# Patient Record
Sex: Female | Born: 1950 | Race: White | Hispanic: No | Marital: Single | Smoking: Former smoker
Health system: Southern US, Community
[De-identification: ages and names within clinical notes are randomized; demographics above are authoritative.]

## PROBLEM LIST (undated history)

## (undated) DIAGNOSIS — M1990 Unspecified osteoarthritis, unspecified site: Secondary | ICD-10-CM

## (undated) DIAGNOSIS — K219 Gastro-esophageal reflux disease without esophagitis: Secondary | ICD-10-CM

## (undated) DIAGNOSIS — E119 Type 2 diabetes mellitus without complications: Secondary | ICD-10-CM

## (undated) DIAGNOSIS — I10 Essential (primary) hypertension: Secondary | ICD-10-CM

## (undated) DIAGNOSIS — J45909 Unspecified asthma, uncomplicated: Secondary | ICD-10-CM

## (undated) HISTORY — PX: BREAST CYST ASPIRATION: SHX578

## (undated) HISTORY — PX: TONSILLECTOMY: SUR1361

## (undated) HISTORY — PX: APPENDECTOMY: SHX54

## (undated) HISTORY — PX: BLADDER SURGERY: SHX569

## (undated) HISTORY — PX: CATARACT EXTRACTION: SUR2

---

## 2000-09-28 ENCOUNTER — Emergency Department (HOSPITAL_COMMUNITY): Admission: EM | Admit: 2000-09-28 | Discharge: 2000-09-28 | Payer: Self-pay | Admitting: Emergency Medicine

## 2000-09-28 ENCOUNTER — Encounter: Payer: Self-pay | Admitting: Emergency Medicine

## 2000-10-02 ENCOUNTER — Encounter: Admission: RE | Admit: 2000-10-02 | Discharge: 2000-10-02 | Payer: Self-pay

## 2001-06-23 HISTORY — PX: ABDOMINAL HYSTERECTOMY: SHX81

## 2004-08-05 ENCOUNTER — Ambulatory Visit: Payer: Self-pay

## 2004-08-16 ENCOUNTER — Ambulatory Visit: Payer: Self-pay

## 2004-09-19 ENCOUNTER — Ambulatory Visit: Payer: Self-pay

## 2005-09-22 ENCOUNTER — Ambulatory Visit: Payer: Self-pay | Admitting: Gastroenterology

## 2005-10-09 ENCOUNTER — Ambulatory Visit: Payer: Self-pay | Admitting: Internal Medicine

## 2005-11-16 ENCOUNTER — Emergency Department: Payer: Self-pay | Admitting: Emergency Medicine

## 2005-12-02 ENCOUNTER — Ambulatory Visit: Payer: Self-pay | Admitting: Surgery

## 2006-03-20 ENCOUNTER — Ambulatory Visit: Payer: Self-pay | Admitting: Internal Medicine

## 2007-09-30 IMAGING — CT CT ABDOMEN WO/W CM
2 of 4 series · 14 of 32 positions shown, 19 images · non-contrast
Comparison: none

REASON FOR EXAM: Kidney mass, abnormal ultrasound
COMMENTS:

PROCEDURE:     CT  - CT ABDOMEN STANDARD W/WO  - October 09, 2005  [DATE]
RESULT:
HISTORY: Renal mass, abnormal ultrasound.

[Series 2: without · axial · non-contrast · 0.78mm/px · z∈[+669,+853]mm · 7 of 31 slices shown, 12 images]
[im 4/31  soft-tissue]
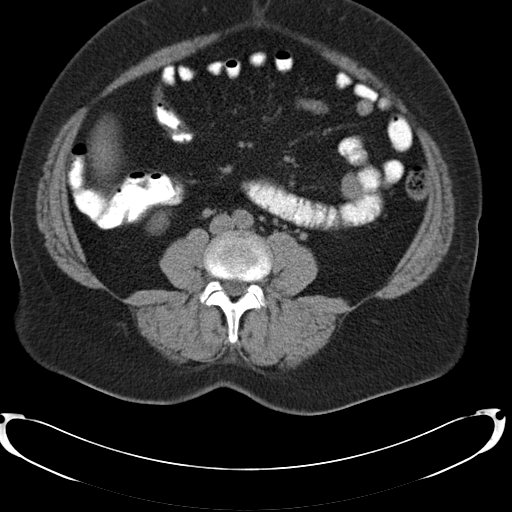
[im 4/31  bone]
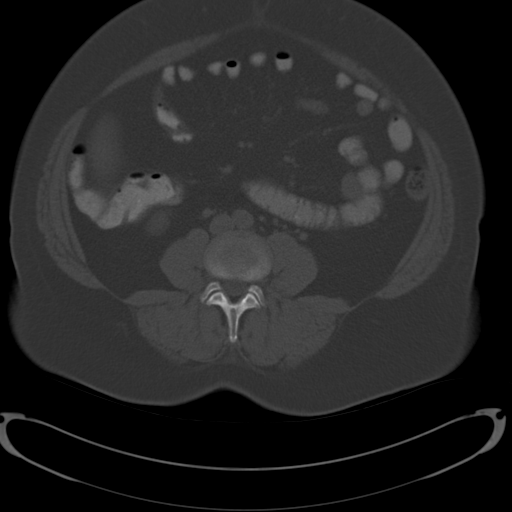
[im 8/31  soft-tissue]
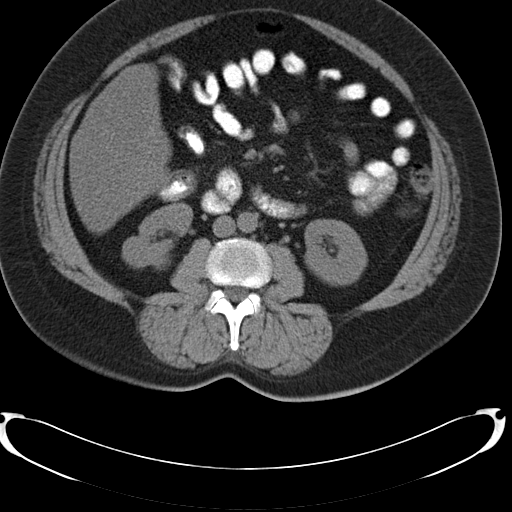
[im 12/31  soft-tissue]
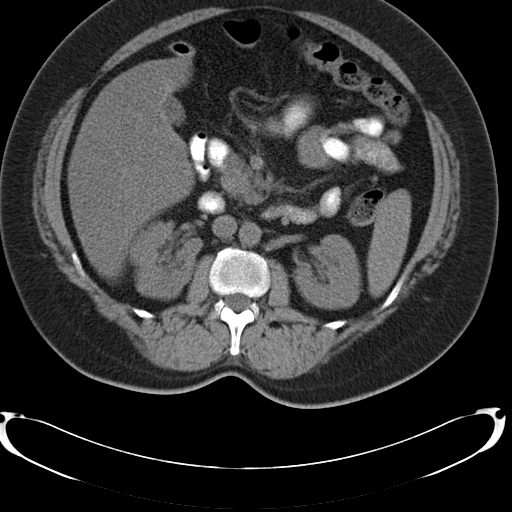
[im 16/31  soft-tissue]
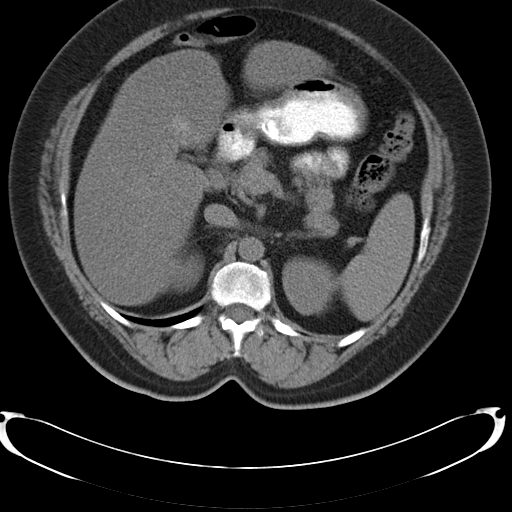
[im 16/31  lung]
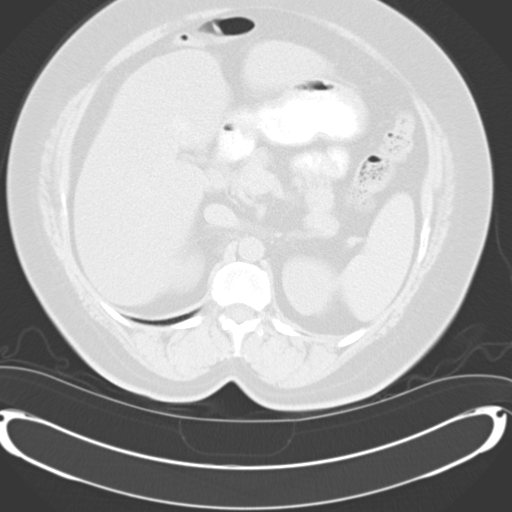
[im 19/31  soft-tissue]
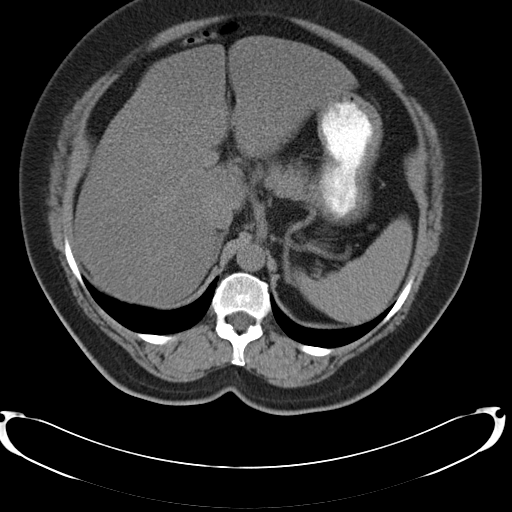
[im 19/31  lung]
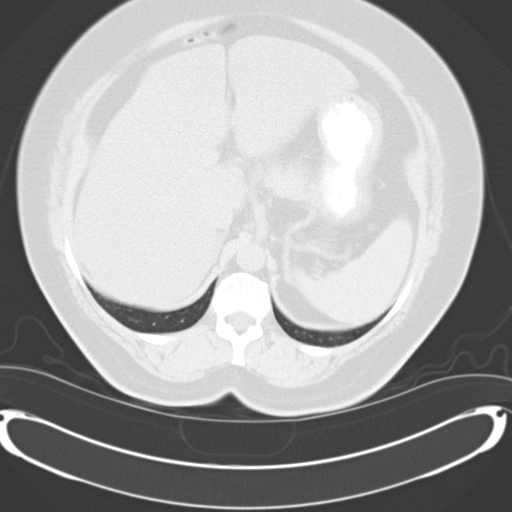
[im 23/31  soft-tissue]
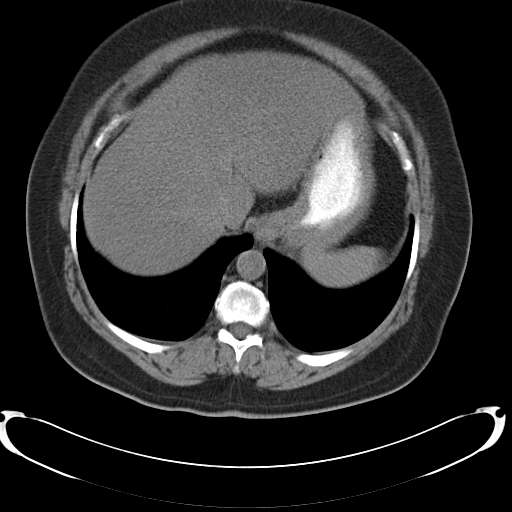
[im 23/31  lung]
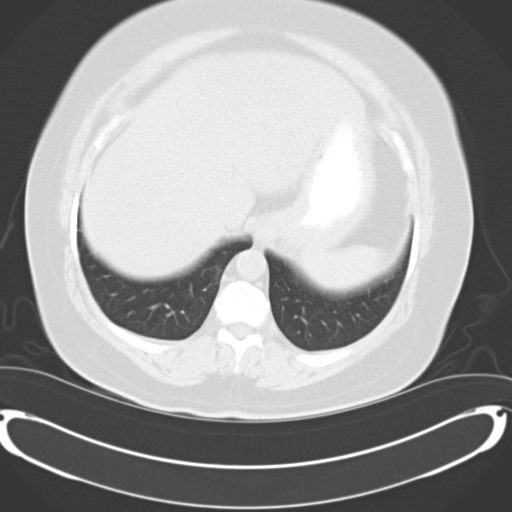
[im 27/31  soft-tissue]
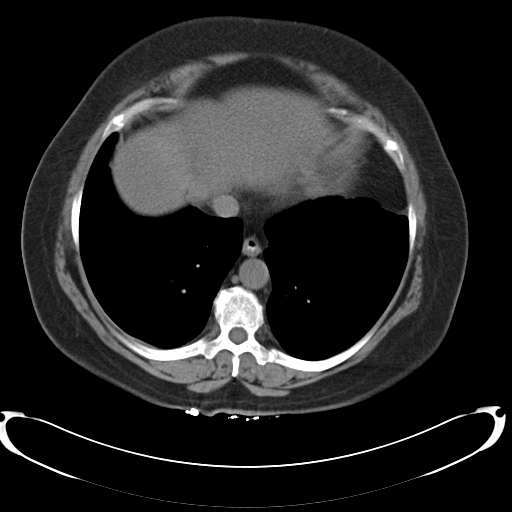
[im 27/31  lung]
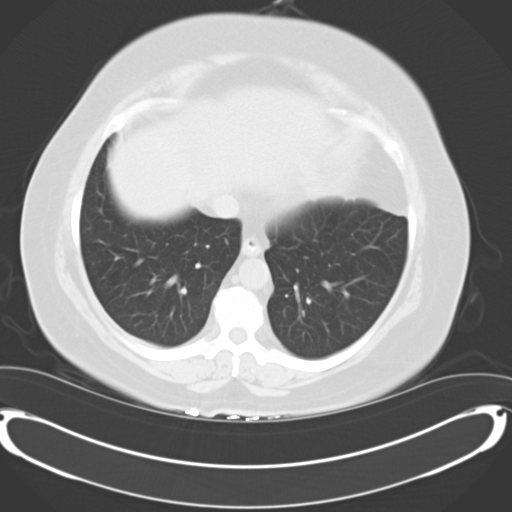

[Series 4: with · axial · 0.78mm/px · z∈[+669,+853]mm · 7 of 31 slices shown]
[im 4/31  soft-tissue]
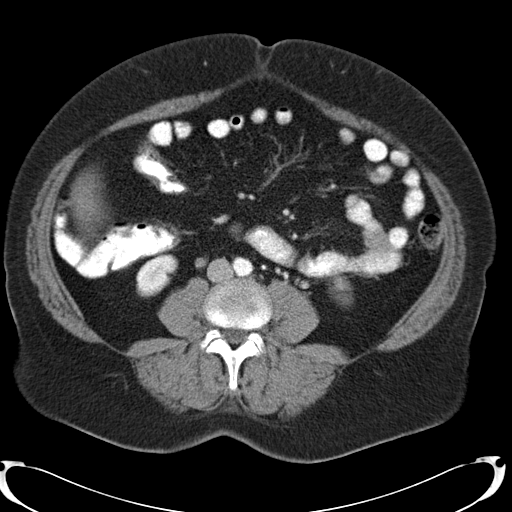
[im 8/31  soft-tissue]
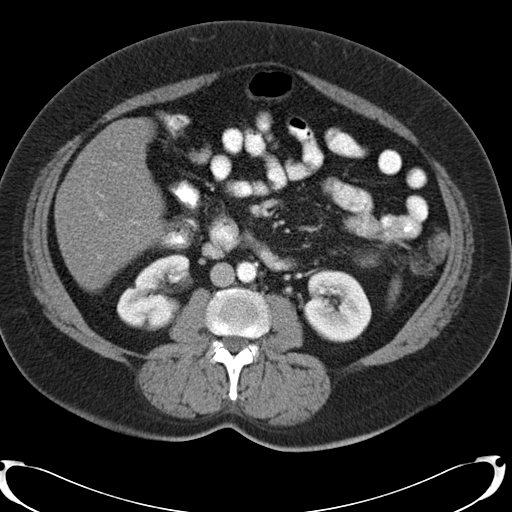
[im 12/31  soft-tissue]
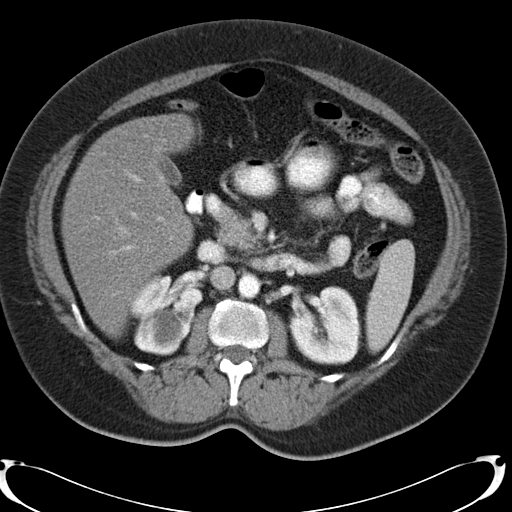
[im 16/31  soft-tissue]
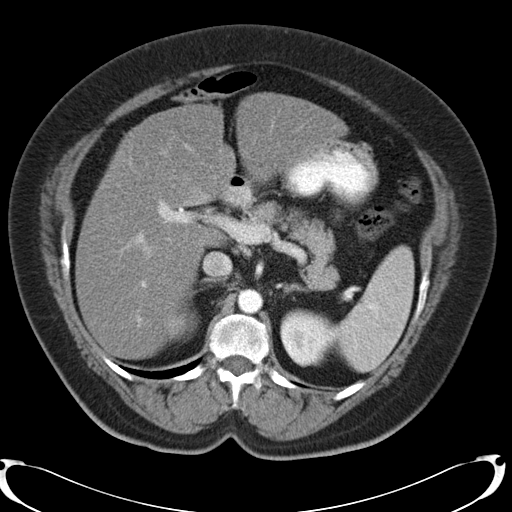
[im 19/31  soft-tissue]
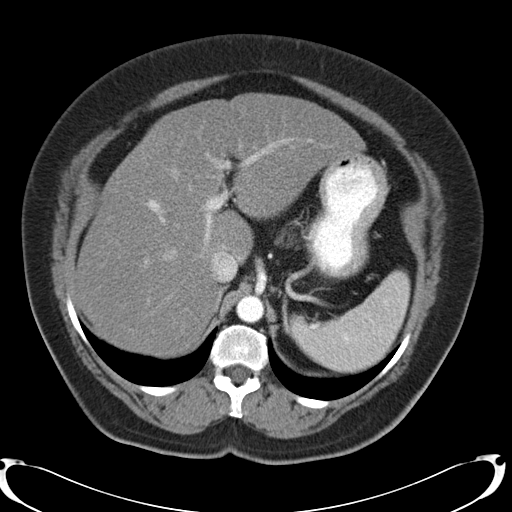
[im 23/31  soft-tissue]
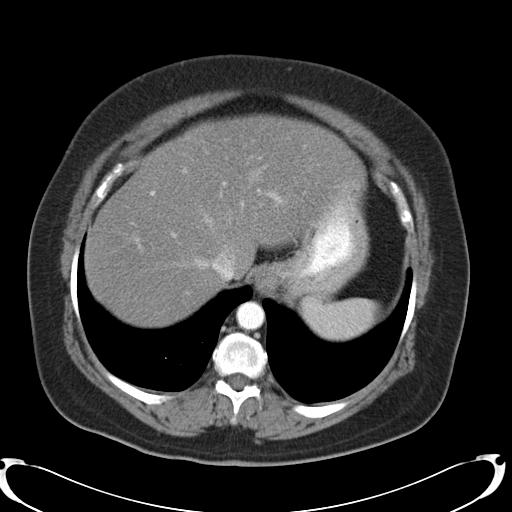
[im 27/31  soft-tissue]
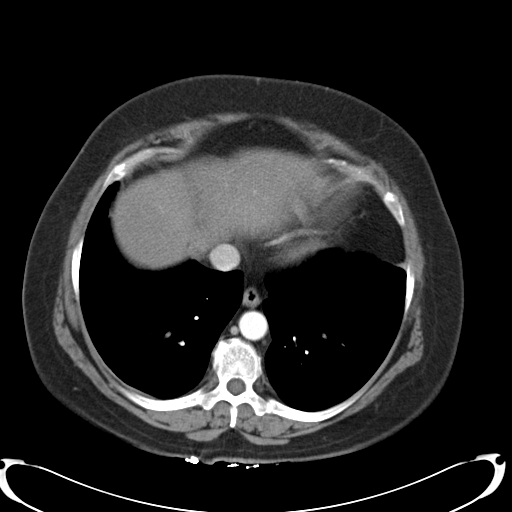

[14 of 32 positions shown; findings below may reference images not displayed]

COMPARISON STUDIES:      Prior abdominal ultrasound of 09/22/2005.

PROCEDURE AND FINDINGS:      Triphasic CT of the abdomen was obtained.  On
the nonenhanced scan, there is no evidence of renal stone.  The previously
identified mass in the upper pole of the RIGHT kidney by ultrasound
represents a simple cyst.  Hounsfield units of 15 are noted.  No definite
complex components are noted by CT.  It may be wise to follow this lesion
with serial ultrasounds at six-month intervals for two years to ensure that
it is stable, given the complex appearance by ultrasound.  The liver and
spleen are unremarkable.  There may be mild fatty replacement of the liver.
The pancreas is unremarkable.  There is no biliary distention.  The portal
and hepatic veins are patent.  The adrenals are normal.  The abdominal aorta
is normal.  The retroperitoneum is normal.  There is no bowel distention.
IMPRESSION: Findings most consistent with a large simple cyst in
the upper pole of the RIGHT kidney.  Given the questionable complex nature
of this mass by ultrasound, and lack of malignant characteristics by CT,
this lesion can be simply followed at six-month intervals with serial renal
ultrasound.  Follow up scans at six-month intervals for a two-year period is
suggested.

## 2010-02-02 ENCOUNTER — Ambulatory Visit: Payer: Self-pay | Admitting: Internal Medicine

## 2010-02-05 ENCOUNTER — Ambulatory Visit: Payer: Self-pay | Admitting: Family Medicine

## 2010-02-06 ENCOUNTER — Ambulatory Visit: Payer: Self-pay | Admitting: Internal Medicine

## 2010-02-09 ENCOUNTER — Ambulatory Visit: Payer: Self-pay | Admitting: Family Medicine

## 2010-02-11 ENCOUNTER — Ambulatory Visit: Payer: Self-pay | Admitting: Internal Medicine

## 2013-03-10 ENCOUNTER — Ambulatory Visit: Payer: Self-pay | Admitting: Family Medicine

## 2013-03-13 ENCOUNTER — Emergency Department: Payer: Self-pay | Admitting: Emergency Medicine

## 2013-03-13 LAB — URINALYSIS, COMPLETE
Bilirubin,UR: NEGATIVE
Blood: NEGATIVE
Glucose,UR: NEGATIVE mg/dL (ref 0–75)
Ketone: NEGATIVE
Nitrite: NEGATIVE
Ph: 9 (ref 4.5–8.0)
Protein: 150
RBC,UR: 14 /HPF (ref 0–5)
Specific Gravity: 1 (ref 1.003–1.030)
Squamous Epithelial: 6
WBC UR: 6 /HPF (ref 0–5)

## 2013-03-13 LAB — CBC
HCT: 42.7 % (ref 35.0–47.0)
HGB: 14.6 g/dL (ref 12.0–16.0)
MCH: 29.1 pg (ref 26.0–34.0)
MCHC: 34.1 g/dL (ref 32.0–36.0)
MCV: 85 fL (ref 80–100)
Platelet: 411 10*3/uL (ref 150–440)
RBC: 5.01 10*6/uL (ref 3.80–5.20)
RDW: 13.7 % (ref 11.5–14.5)
WBC: 14.7 10*3/uL — ABNORMAL HIGH (ref 3.6–11.0)

## 2013-03-13 LAB — BASIC METABOLIC PANEL
Anion Gap: 10 (ref 7–16)
BUN: 14 mg/dL (ref 7–18)
Calcium, Total: 9.4 mg/dL (ref 8.5–10.1)
Chloride: 102 mmol/L (ref 98–107)
Co2: 26 mmol/L (ref 21–32)
Creatinine: 1.03 mg/dL (ref 0.60–1.30)
EGFR (African American): >60
EGFR (Non-African Amer.): 59 — ABNORMAL LOW
Glucose: 156 mg/dL — ABNORMAL HIGH (ref 65–99)
Osmolality: 279 (ref 275–301)
Potassium: 3 mmol/L — ABNORMAL LOW (ref 3.5–5.1)
Sodium: 138 mmol/L (ref 136–145)

## 2014-07-03 ENCOUNTER — Ambulatory Visit: Payer: Self-pay | Admitting: Family Medicine

## 2014-07-03 ENCOUNTER — Ambulatory Visit: Payer: Self-pay | Admitting: Physician Assistant

## 2014-07-03 LAB — URINALYSIS, COMPLETE
Bilirubin,UR: NEGATIVE
Blood: NEGATIVE
Glucose,UR: NEGATIVE
Ketone: NEGATIVE
Nitrite: NEGATIVE
Ph: 7.5 (ref 5.0–8.0)
Specific Gravity: 1.02 (ref 1.000–1.030)

## 2014-07-03 LAB — COMPREHENSIVE METABOLIC PANEL
Albumin: 3.9 g/dL (ref 3.4–5.0)
Alkaline Phosphatase: 134 U/L — ABNORMAL HIGH
Anion Gap: 11 (ref 7–16)
BUN: 15 mg/dL (ref 7–18)
Bilirubin,Total: 0.2 mg/dL (ref 0.2–1.0)
Calcium, Total: 9.5 mg/dL (ref 8.5–10.1)
Chloride: 101 mmol/L (ref 98–107)
Co2: 25 mmol/L (ref 21–32)
Creatinine: 0.9 mg/dL (ref 0.60–1.30)
EGFR (African American): >60
EGFR (Non-African Amer.): >60
Glucose: 117 mg/dL — ABNORMAL HIGH (ref 65–99)
Osmolality: 276 (ref 275–301)
Potassium: 3.3 mmol/L — ABNORMAL LOW (ref 3.5–5.1)
SGOT(AST): 30 U/L (ref 15–37)
SGPT (ALT): 42 U/L
Sodium: 137 mmol/L (ref 136–145)
Total Protein: 8.1 g/dL (ref 6.4–8.2)

## 2014-07-03 LAB — LIPASE, BLOOD: Lipase: 109 U/L (ref 73–393)

## 2014-07-03 LAB — CBC WITH DIFFERENTIAL/PLATELET
Basophil #: 0.2 10*3/uL — ABNORMAL HIGH (ref 0.0–0.1)
Basophil %: 1.2 %
Eosinophil #: 0.2 10*3/uL (ref 0.0–0.7)
Eosinophil %: 1.8 %
HCT: 42.3 % (ref 35.0–47.0)
HGB: 13.9 g/dL (ref 12.0–16.0)
Lymphocyte #: 3.4 10*3/uL (ref 1.0–3.6)
Lymphocyte %: 24.7 %
MCH: 28.7 pg (ref 26.0–34.0)
MCHC: 32.9 g/dL (ref 32.0–36.0)
MCV: 87 fL (ref 80–100)
Monocyte #: 0.8 x10 3/mm (ref 0.2–0.9)
Monocyte %: 5.9 %
Neutrophil #: 9.1 10*3/uL — ABNORMAL HIGH (ref 1.4–6.5)
Neutrophil %: 66.4 %
Platelet: 418 10*3/uL (ref 150–440)
RBC: 4.85 10*6/uL (ref 3.80–5.20)
RDW: 13.4 % (ref 11.5–14.5)
WBC: 13.8 10*3/uL — ABNORMAL HIGH (ref 3.6–11.0)

## 2014-07-03 LAB — AMYLASE: Amylase: 22 U/L — ABNORMAL LOW (ref 25–115)

## 2014-07-07 LAB — URINE CULTURE

## 2014-10-25 ENCOUNTER — Other Ambulatory Visit: Payer: Self-pay

## 2014-10-25 DIAGNOSIS — Z1239 Encounter for other screening for malignant neoplasm of breast: Secondary | ICD-10-CM

## 2014-10-26 ENCOUNTER — Ambulatory Visit
Admission: RE | Admit: 2014-10-26 | Discharge: 2014-10-26 | Disposition: A | Discharge status: Home / Self Care | Payer: No Typology Code available for payment source | Source: Ambulatory Visit | Attending: Family Medicine | Admitting: Family Medicine

## 2014-10-26 ENCOUNTER — Other Ambulatory Visit: Payer: Self-pay | Admitting: Family Medicine

## 2014-10-26 DIAGNOSIS — Z1239 Encounter for other screening for malignant neoplasm of breast: Secondary | ICD-10-CM

## 2014-10-26 DIAGNOSIS — Z1231 Encounter for screening mammogram for malignant neoplasm of breast: Secondary | ICD-10-CM | POA: Diagnosis not present

## 2014-11-02 ENCOUNTER — Encounter: Payer: Self-pay | Admitting: *Deleted

## 2014-11-06 NOTE — Discharge Instructions (Signed)

## 2014-11-08 ENCOUNTER — Encounter: Payer: Self-pay | Admitting: Gastroenterology

## 2014-11-08 ENCOUNTER — Encounter
Admission: RE | Disposition: A | Discharge status: Home / Self Care | Payer: Self-pay | Source: Ambulatory Visit | Attending: Gastroenterology

## 2014-11-08 ENCOUNTER — Ambulatory Visit
Admission: RE | Admit: 2014-11-08 | Discharge: 2014-11-08 | Disposition: A | Discharge status: Home / Self Care | Payer: No Typology Code available for payment source | Source: Ambulatory Visit | Attending: Gastroenterology | Admitting: Gastroenterology

## 2014-11-08 ENCOUNTER — Ambulatory Visit: Payer: No Typology Code available for payment source | Admitting: Anesthesiology

## 2014-11-08 DIAGNOSIS — R140 Abdominal distension (gaseous): Secondary | ICD-10-CM | POA: Diagnosis not present

## 2014-11-08 DIAGNOSIS — R1013 Epigastric pain: Secondary | ICD-10-CM | POA: Diagnosis not present

## 2014-11-08 DIAGNOSIS — J45909 Unspecified asthma, uncomplicated: Secondary | ICD-10-CM | POA: Insufficient documentation

## 2014-11-08 DIAGNOSIS — Z882 Allergy status to sulfonamides status: Secondary | ICD-10-CM | POA: Insufficient documentation

## 2014-11-08 DIAGNOSIS — A048 Other specified bacterial intestinal infections: Secondary | ICD-10-CM | POA: Diagnosis not present

## 2014-11-08 DIAGNOSIS — K2950 Unspecified chronic gastritis without bleeding: Secondary | ICD-10-CM | POA: Insufficient documentation

## 2014-11-08 DIAGNOSIS — K219 Gastro-esophageal reflux disease without esophagitis: Secondary | ICD-10-CM | POA: Insufficient documentation

## 2014-11-08 DIAGNOSIS — Z79899 Other long term (current) drug therapy: Secondary | ICD-10-CM | POA: Insufficient documentation

## 2014-11-08 DIAGNOSIS — R1310 Dysphagia, unspecified: Secondary | ICD-10-CM | POA: Diagnosis present

## 2014-11-08 DIAGNOSIS — K222 Esophageal obstruction: Secondary | ICD-10-CM | POA: Diagnosis not present

## 2014-11-08 HISTORY — DX: Unspecified asthma, uncomplicated: J45.909

## 2014-11-08 HISTORY — DX: Essential (primary) hypertension: I10

## 2014-11-08 HISTORY — DX: Gastro-esophageal reflux disease without esophagitis: K21.9

## 2014-11-08 HISTORY — DX: Unspecified osteoarthritis, unspecified site: M19.90

## 2014-11-08 HISTORY — PX: ESOPHAGOGASTRODUODENOSCOPY: SHX5428

## 2014-11-08 SURGERY — EGD (ESOPHAGOGASTRODUODENOSCOPY)
Anesthesia: Monitor Anesthesia Care | Wound class: Clean Contaminated

## 2014-11-08 MED ORDER — ACETAMINOPHEN 160 MG/5ML PO SOLN: 325.0000 mg | ORAL | Status: DC | PRN

## 2014-11-08 MED ORDER — GLYCOPYRROLATE 0.2 MG/ML IJ SOLN
INTRAMUSCULAR | Status: DC | PRN
  Administered 2014-11-08: 0.2 mg via INTRAVENOUS

## 2014-11-08 MED ORDER — PROPOFOL 10 MG/ML IV BOLUS
INTRAVENOUS | Status: DC | PRN
  Administered 2014-11-08: 50 mg via INTRAVENOUS
  Administered 2014-11-08: 100 mg via INTRAVENOUS
  Administered 2014-11-08: 50 mg via INTRAVENOUS

## 2014-11-08 MED ORDER — LIDOCAINE HCL (CARDIAC) 20 MG/ML IV SOLN
INTRAVENOUS | Status: DC | PRN
  Administered 2014-11-08: 30 mg via INTRAVENOUS

## 2014-11-08 MED ORDER — ACETAMINOPHEN 325 MG PO TABS: 325.0000 mg | ORAL_TABLET | ORAL | Status: DC | PRN

## 2014-11-08 MED ORDER — SODIUM CHLORIDE 0.9 % IV SOLN: INTRAVENOUS | Status: DC

## 2014-11-08 MED ORDER — LACTATED RINGERS IV SOLN
INTRAVENOUS | Status: DC
  Administered 2014-11-08 (×2): via INTRAVENOUS

## 2014-11-08 SURGICAL SUPPLY — 41 items
BALLN DILATOR 10-12 8 (BALLOONS)
BALLN DILATOR 12-15 8 (BALLOONS)
BALLN DILATOR 15-18 8 (BALLOONS)
BALLN DILATOR CRE 0-12 8 (BALLOONS)
BALLN DILATOR ESOPH 8 10 CRE (MISCELLANEOUS) IMPLANT
BALLOON DILATOR 12-15 8 (BALLOONS) IMPLANT
BALLOON DILATOR 15-18 8 (BALLOONS) IMPLANT
BALLOON DILATOR CRE 0-12 8 (BALLOONS) IMPLANT
BIG 60 ×3 IMPLANT
BLOCK BITE 60FR ADLT L/F GRN (MISCELLANEOUS) ×3 IMPLANT
CANISTER SUCT 1200ML W/VALVE (MISCELLANEOUS) ×3 IMPLANT
FCP ESCP3.2XJMB 240X2.8X (MISCELLANEOUS)
FORCEPS BIOP RAD 4 LRG CAP 4 (CUTTING FORCEPS) IMPLANT
FORCEPS BIOP RJ4 240 W/NDL (MISCELLANEOUS)
FORCEPS ESCP3.2XJMB 240X2.8X (MISCELLANEOUS) IMPLANT
GOWN CVR UNV OPN BCK APRN NK (MISCELLANEOUS) ×1 IMPLANT
GOWN ISOL THUMB LOOP REG UNIV (MISCELLANEOUS) ×3
GOWN STRL REUS W/ TWL LRG LVL3 (GOWN DISPOSABLE) ×1 IMPLANT
GOWN STRL REUS W/TWL LRG LVL3 (GOWN DISPOSABLE) ×3
HEMOCLIP INSTINCT (CLIP) IMPLANT
INJECTOR VARIJECT VIN23 (MISCELLANEOUS) IMPLANT
KIT CO2 TUBING (TUBING) IMPLANT
KIT DEFENDO VALVE AND CONN (KITS) IMPLANT
KIT ENDO PROCEDURE OLY (KITS) ×3 IMPLANT
LIGATOR MULTIBAND 6SHOOTER MBL (MISCELLANEOUS) IMPLANT
MARKER SPOT ENDO TATTOO 5ML (MISCELLANEOUS) IMPLANT
PAD GROUND ADULT SPLIT (MISCELLANEOUS) IMPLANT
SNARE SHORT THROW 13M SML OVAL (MISCELLANEOUS) IMPLANT
SNARE SHORT THROW 30M LRG OVAL (MISCELLANEOUS) IMPLANT
SPOT EX ENDOSCOPIC TATTOO (MISCELLANEOUS)
SUCTION POLY TRAP 4CHAMBER (MISCELLANEOUS) IMPLANT
SYR INFLATION 60ML (SYRINGE) IMPLANT
TRAP SUCTION POLY (MISCELLANEOUS) IMPLANT
TUBING CONN 6MMX3.1M (TUBING)
TUBING SUCTION CONN 0.25 STRL (TUBING) IMPLANT
UNDERPAD 30X60 958B10 (PK) (MISCELLANEOUS) IMPLANT
VALVE BIOPSY ENDO (VALVE) IMPLANT
VARIJECT INJECTOR VIN23 (MISCELLANEOUS)
WATER AUXILLARY (MISCELLANEOUS) IMPLANT
WATER STERILE IRR 500ML POUR (IV SOLUTION) ×3 IMPLANT
WIRE CRE 18-20MM 8CM F G (MISCELLANEOUS) ×2 IMPLANT

## 2014-11-08 NOTE — Anesthesia Preprocedure Evaluation (Addendum)
Anesthesia Evaluation  Patient identified by MRN, date of birth, ID band  Reviewed: Allergy & Precautions, H&P , NPO status , Patient's Chart, lab work & pertinent test results  Airway Mallampati: III  TM Distance: >3 FB Neck ROM: full    Dental  (+) Edentulous Upper, Poor Dentition   Pulmonary asthma , former smoker,    Pulmonary exam normal       Cardiovascular hypertension, Rhythm:regular Rate:Normal     Neuro/Psych    GI/Hepatic GERD-  Medicated,  Endo/Other    Renal/GU      Musculoskeletal   Abdominal   Peds  Hematology   Anesthesia Other Findings   Reproductive/Obstetrics                            Anesthesia Physical Anesthesia Plan  ASA: II  Anesthesia Plan: MAC   Post-op Pain Management:    Induction:   Airway Management Planned:   Additional Equipment:   Intra-op Plan:   Post-operative Plan:   Informed Consent: I have reviewed the patients History and Physical, chart, labs and discussed the procedure including the risks, benefits and alternatives for the proposed anesthesia with the patient or authorized representative who has indicated his/her understanding and acceptance.     Plan Discussed with: CRNA  Anesthesia Plan Comments:         Anesthesia Quick Evaluation

## 2014-11-08 NOTE — Anesthesia Postprocedure Evaluation (Signed)
  Anesthesia Post-op Note  Patient: Misty Black  Procedure(s) Performed: Procedure(s): ESOPHAGOGASTRODUODENOSCOPY (EGD) with dialation (N/A)  Anesthesia type:MAC  Patient location: PACU  Post pain: Pain level controlled  Post assessment: Post-op Vital signs reviewed, Patient's Cardiovascular Status Stable, Respiratory Function Stable, Patent Airway and No signs of Nausea or vomiting  Post vital signs: Reviewed and stable  Last Vitals:  Filed Vitals:   11/08/14 1005  BP: 144/80  Pulse: 77  Temp: 36.6 C  Resp: 20    Level of consciousness: awake, alert  and patient cooperative  Complications: No apparent anesthesia complications

## 2014-11-08 NOTE — Transfer of Care (Signed)
Immediate Anesthesia Transfer of Care Note  Patient: Georgiann CockerJoyce M Fenley  Procedure(s) Performed: Procedure(s): ESOPHAGOGASTRODUODENOSCOPY (EGD) with dialation (N/A)  Patient Location: PACU  Anesthesia Type: MAC  Level of Consciousness: awake, alert  and patient cooperative  Airway and Oxygen Therapy: Patient Spontanous Breathing and Patient connected to supplemental oxygen  Post-op Assessment: Post-op Vital signs reviewed, Patient's Cardiovascular Status Stable, Respiratory Function Stable, Patent Airway and No signs of Nausea or vomiting  Post-op Vital Signs: Reviewed and stable  Complications: No apparent anesthesia complications

## 2014-11-08 NOTE — Anesthesia Procedure Notes (Signed)
Procedure Name: MAC Performed by: Angelys Yetman Pre-anesthesia Checklist: Patient identified, Emergency Drugs available, Suction available, Patient being monitored and Timeout performed Patient Re-evaluated:Patient Re-evaluated prior to inductionOxygen Delivery Method: Nasal cannula       

## 2014-11-08 NOTE — H&P (Signed)
  Date of Initial H&P: 10/31/2014 History reviewed, patient examined, no change in status, stable for surgery. 

## 2014-11-08 NOTE — Op Note (Signed)
[facility] Gastroenterology Patient Name: Misty Black Procedure Date: 11/08/2014 9:35 AM MRN: [id number] Account #: [id number] Date of Birth: 11/13/1950 Admit Type: Outpatient Age: 64 [facility] Gender: Female Note Status: Finalized Procedure:         Upper GI endoscopy Indications:       Dyspepsia, abdominal bloating Providers:         Ezzard StandingPaul Y. Bluford Kaufmannh, MD Referring MD:      Courtney ParisElizabeth B. Cliffton AstersWhite, MD (Referring MD) Medicines:         Monitored Anesthesia Care Complications:     No immediate complications. Procedure:         Pre-Anesthesia Assessment:                    - Prior to the procedure, a History and Physical was                     performed, and patient medications, allergies and                     sensitivities were reviewed. The patient's tolerance of                     previous anesthesia was reviewed.                    - The risks and benefits of the procedure and the sedation                     options and risks were discussed with the patient. All                     questions were answered and informed consent was obtained.                    - After reviewing the risks and benefits, the patient was                     deemed in satisfactory condition to undergo the procedure.                    After obtaining informed consent, the endoscope was passed                     under direct vision. Throughout the procedure, the                     patient's blood pressure, pulse, and oxygen saturations                     were monitored continuously. The Olympus GIF H180J                     endoscope (S#: [id number]) was introduced through the mouth,                     and advanced to the second part of duodenum. The upper GI                     endoscopy was accomplished without difficulty. The patient                     tolerated the procedure well. Findings:      No endoscopic abnormality was evident in the esophagus to  explain the        patient's complaint of dysphagia. It was decided, however, to proceed       with dilation of the entire esophagus. A TTS dilator was passed through       the scope. Dilation with a 15-16.5-18 mm balloon (to a maximum balloon       size of 18 mm) dilator was performed. Biopsies were taken with a cold       forceps for histology.      The entire examined stomach was normal. Biopsies were taken with a cold       forceps for histology.      The examined duodenum was normal. Impression:        - No endoscopic esophageal abnormality to explain                     patient's dysphagia. Esophagus dilated. Dilated. Biopsied.                    - Normal stomach. Biopsied.                    - Normal examined duodenum. Recommendation:    - Discharge patient to home.                    - Continue present medications.                    - Observe patient's clinical course.                    - Await pathology results.                    - The findings and recommendations were discussed with the                     patient. Procedure Code(s): --- Professional ---                    [phone], Esophagogastroduodenoscopy, flexible, transoral;                     with transendoscopic balloon dilation of esophagus (less                     than 30 mm diameter)                    43239, Esophagogastroduodenoscopy, flexible, transoral;                     with biopsy, single or multiple Diagnosis Code(s): --- Professional ---                    R13.10, Dysphagia, unspecified                    K30, Functional dyspepsia CPT copyright 2014 American Medical Association. All rights reserved. The codes documented in this report are preliminary and upon coder review may  be revised to meet current compliance requirements. Wallace CullensPaul Y Tavyn Kurka, MD 11/08/2014 9:45:43 AM This report has been signed electronically. Number of Addenda: 0 Note Initiated On: 11/08/2014 9:35 AM Total Procedure Duration: 0 hours 4 minutes 33 seconds        [facility]

## 2014-11-09 ENCOUNTER — Encounter: Payer: Self-pay | Admitting: Gastroenterology

## 2014-11-13 LAB — SURGICAL PATHOLOGY

## 2015-01-07 ENCOUNTER — Ambulatory Visit
Admission: EM | Admit: 2015-01-07 | Discharge: 2015-01-07 | Disposition: A | Discharge status: Home / Self Care | Payer: No Typology Code available for payment source | Attending: Internal Medicine | Admitting: Internal Medicine

## 2015-01-07 DIAGNOSIS — J019 Acute sinusitis, unspecified: Secondary | ICD-10-CM | POA: Diagnosis not present

## 2015-01-07 DIAGNOSIS — J209 Acute bronchitis, unspecified: Secondary | ICD-10-CM | POA: Diagnosis not present

## 2015-01-07 MED ORDER — AZITHROMYCIN 250 MG PO TABS: 250.0000 mg | ORAL_TABLET | Freq: Every day | ORAL | Status: DC

## 2015-01-07 MED ORDER — PREDNISONE 50 MG PO TABS: 50.0000 mg | ORAL_TABLET | Freq: Every day | ORAL | Status: DC

## 2015-01-07 NOTE — Discharge Instructions (Signed)
Prescriptions for prednisone and [facility] [place]. Recheck or followup pcp/Bess White if not improving soon. Cough may take up to 3 weeks to subside; recheck for new fever >100.5, increasing phlegm production.

## 2015-01-07 NOTE — ED Provider Notes (Addendum)
CSN: [id number]     Arrival date & time 01/07/15  1051 History   None    Chief Complaint  Patient presents with  . Cough   HPI Patient is a 64 year old lady with past history of asthma. She presents today with a two-week history of sinus congestion, runny nose/postnasal drainage, cough that is intermittently productive. Feeling worse instead of better, the heat is really bothering her. Dyspnea, malaise, fatigue. Tactile temps. Headache. No nausea/vomiting, no diarrhea. Has been having to use her albuterol inhaler. PCP is Huston Foley at [facility]  Past Medical History  Diagnosis Date  . GERD (gastroesophageal reflux disease)   . Asthma   . Hypertension   . Arthritis     Back - shoulders   Past Surgical History  Procedure Laterality Date  . Breast cyst aspiration      not sure which side  . Abdominal hysterectomy  2003  . Cataract extraction Bilateral   . Appendectomy    . Tonsillectomy    . Bladder surgery      as child  . Esophagogastroduodenoscopy N/A 11/08/2014    Procedure: ESOPHAGOGASTRODUODENOSCOPY (EGD) with dialation;  Surgeon: Wallace Cullens, MD;  Location: [facility];  Service: Gastroenterology;  Laterality: N/A;   Family History  Problem Relation Age of Onset  . Breast cancer Mother 3   History  Substance Use Topics  . Smoking status: Former Smoker    Quit date: 06/23/1994  . Smokeless tobacco: Not on file  . Alcohol Use: No    Review of Systems  All other systems reviewed and are negative.   Allergies  Dairy aid; Ibuprofen; Sulfa antibiotics; and Sulfur  Home Medications   Prior to Admission medications   Medication Sig Start Date End Date Taking? Authorizing Provider  albuterol (PROVENTIL HFA;VENTOLIN HFA) 108 (90 BASE) MCG/ACT inhaler Inhale 2 puffs into the lungs every 6 (six) hours as needed for wheezing or shortness of breath.    Historical Provider, MD  aspirin 81 MG tablet Take 81 mg by mouth daily. PM    Historical Provider, MD             lisinopril (PRINIVIL,ZESTRIL) 20 MG tablet Take 20 mg by mouth daily. AM    Historical Provider, MD  MAGNESIUM PO Take by mouth daily. AM    Historical Provider, MD  pantoprazole (PROTONIX) 40 MG tablet Take 40 mg by mouth daily. PM    Historical Provider, MD  triamterene-hydrochlorothiazide (DYAZIDE) 37.5-25 MG per capsule Take 1 capsule by mouth daily. AM    Historical Provider, MD   BP 129/31 mmHg  Temp(Src) 98 F (36.7 C) (Oral)  Resp 20  Ht 5' (1.524 m)  Wt 201 lb (91.173 kg)  BMI 39.26 kg/m2  SpO2 96% Physical Exam  Constitutional: She is oriented to person, place, and time.  Alert, nicely groomed Sitting up on the end of the exam table Looks mildly short of breath, splinting slightly Voice sounds very congested Looks ill but not toxic  HENT:  Head: Atraumatic.  Bilateral TMs with mild dullness, left TM is red tinged Marked nasal congestion with excoriation and mucopurulent material present Throat is red and difficult to visualize  Eyes:  Conjugate gaze, no eye redness/drainage  Neck: Neck supple.  Cardiovascular: Normal rate and regular rhythm.   Heart rate is 83  Pulmonary/Chest: No respiratory distress. She has no wheezes. She has no rales.  Breath sounds are coarse, symmetric breath sounds, quite diminished posteriorly  Abdominal: She  exhibits no distension.  Musculoskeletal: Normal range of motion.  No leg swelling  Neurological: She is alert and oriented to person, place, and time.  Skin: Skin is warm and dry.  No cyanosis  Nursing note and vitals reviewed.   ED Course  Procedures  No procedure at the urgent care today  MDM   1. Bronchitis, acute, with bronchospasm   2. Acute sinusitis with symptoms > 10 days    Prescriptions for Zithromax and prednisone sent to the [facility]. Patient will recheck for temperature greater than 100.5, increasing phlegm production. Check or follow up if not improving in several days. Cough may take up to 3  weeks to subside.    Eustace MooreLaura W Randol Zumstein, MD 01/07/15 1200  Eustace MooreLaura W Shanecia Hoganson, MD 01/07/15 [phone]

## 2015-01-07 NOTE — ED Notes (Signed)
Sinus congestion and cough for two weeks.  Up most of the night coughing. Afebrile, lungs clear.

## 2015-06-25 ENCOUNTER — Emergency Department
Admission: EM | Admit: 2015-06-25 | Discharge: 2015-06-25 | Disposition: A | Discharge status: Home / Self Care | Payer: No Typology Code available for payment source | Attending: Emergency Medicine | Admitting: Emergency Medicine

## 2015-06-25 ENCOUNTER — Emergency Department: Payer: No Typology Code available for payment source

## 2015-06-25 ENCOUNTER — Emergency Department: Payer: Self-pay

## 2015-06-25 ENCOUNTER — Encounter: Payer: Self-pay | Admitting: Emergency Medicine

## 2015-06-25 DIAGNOSIS — Z87891 Personal history of nicotine dependence: Secondary | ICD-10-CM | POA: Insufficient documentation

## 2015-06-25 DIAGNOSIS — J069 Acute upper respiratory infection, unspecified: Secondary | ICD-10-CM | POA: Insufficient documentation

## 2015-06-25 DIAGNOSIS — J45909 Unspecified asthma, uncomplicated: Secondary | ICD-10-CM | POA: Insufficient documentation

## 2015-06-25 DIAGNOSIS — Z7982 Long term (current) use of aspirin: Secondary | ICD-10-CM | POA: Insufficient documentation

## 2015-06-25 DIAGNOSIS — R8299 Other abnormal findings in urine: Secondary | ICD-10-CM | POA: Insufficient documentation

## 2015-06-25 DIAGNOSIS — Z79899 Other long term (current) drug therapy: Secondary | ICD-10-CM | POA: Insufficient documentation

## 2015-06-25 DIAGNOSIS — E119 Type 2 diabetes mellitus without complications: Secondary | ICD-10-CM | POA: Insufficient documentation

## 2015-06-25 DIAGNOSIS — J209 Acute bronchitis, unspecified: Secondary | ICD-10-CM

## 2015-06-25 DIAGNOSIS — I10 Essential (primary) hypertension: Secondary | ICD-10-CM | POA: Insufficient documentation

## 2015-06-25 HISTORY — DX: Type 2 diabetes mellitus without complications: E11.9

## 2015-06-25 LAB — BASIC METABOLIC PANEL
ANION GAP: 7 (ref 5–15)
BUN: 9 mg/dL (ref 6–20)
CALCIUM: 9 mg/dL (ref 8.9–10.3)
CO2: 23 mmol/L (ref 22–32)
Chloride: 106 mmol/L (ref 101–111)
Creatinine, Ser: 0.66 mg/dL (ref 0.44–1.00)
GFR calc non Af Amer: >60 mL/min (ref >60)
Glucose, Bld: 113 mg/dL — ABNORMAL HIGH (ref 65–99)
Potassium: 3.6 mmol/L (ref 3.5–5.1)
Sodium: 136 mmol/L (ref 135–145)

## 2015-06-25 LAB — URINALYSIS COMPLETE WITH MICROSCOPIC (ARMC ONLY)
Bilirubin Urine: NEGATIVE
Glucose, UA: NEGATIVE mg/dL
HGB URINE DIPSTICK: NEGATIVE
Ketones, ur: NEGATIVE mg/dL
Leukocytes, UA: NEGATIVE
NITRITE: NEGATIVE
Protein, ur: NEGATIVE mg/dL
SPECIFIC GRAVITY, URINE: 1.008 (ref 1.005–1.030)
pH: 8 (ref 5.0–8.0)

## 2015-06-25 LAB — CBC WITH DIFFERENTIAL/PLATELET
BASOS ABS: 0.1 10*3/uL (ref 0–0.1)
BASOS PCT: 1 %
Eosinophils Absolute: 0.4 10*3/uL (ref 0–0.7)
Eosinophils Relative: 3 %
HEMATOCRIT: 36.9 % (ref 35.0–47.0)
HEMOGLOBIN: 12.5 g/dL (ref 12.0–16.0)
LYMPHS PCT: 14 %
Lymphs Abs: 1.6 10*3/uL (ref 1.0–3.6)
MCH: 28.3 pg (ref 26.0–34.0)
MCHC: 33.8 g/dL (ref 32.0–36.0)
MCV: 83.9 fL (ref 80.0–100.0)
MONOS PCT: 9 %
Monocytes Absolute: 1 10*3/uL — ABNORMAL HIGH (ref 0.2–0.9)
NEUTROS ABS: 8.8 10*3/uL — AB (ref 1.4–6.5)
NEUTROS PCT: 73 %
Platelets: 344 10*3/uL (ref 150–440)
RBC: 4.4 MIL/uL (ref 3.80–5.20)
RDW: 14.3 % (ref 11.5–14.5)
WBC: 11.9 10*3/uL — ABNORMAL HIGH (ref 3.6–11.0)

## 2015-06-25 MED ORDER — PSEUDOEPHEDRINE HCL 60 MG PO TABS: 60.0000 mg | ORAL_TABLET | ORAL | Status: AC | PRN

## 2015-06-25 MED ORDER — AZITHROMYCIN 250 MG PO TABS: ORAL_TABLET | ORAL | Status: AC

## 2015-06-25 MED ORDER — IOHEXOL 300 MG/ML  SOLN
75.0000 mL | Freq: Once | INTRAMUSCULAR | Status: AC | PRN
  Administered 2015-06-25: 75 mL via INTRAVENOUS
  Filled 2015-06-25: qty 75

## 2015-06-25 MED ORDER — GUAIFENESIN-CODEINE 100-10 MG/5ML PO SOLN: 10.0000 mL | ORAL | Status: AC | PRN

## 2015-06-25 NOTE — Discharge Instructions (Signed)
Acute Bronchitis °Bronchitis is inflammation of the airways that extend from the windpipe into the lungs (bronchi). The inflammation often causes mucus to develop. This leads to a cough, which is the most common symptom of bronchitis.  °In acute bronchitis, the condition usually develops suddenly and goes away over time, usually in a couple weeks. Smoking, allergies, and asthma can make bronchitis worse. Repeated episodes of bronchitis may cause further lung problems.  °CAUSES °Acute bronchitis is most often caused by the same virus that causes a cold. The virus can spread from person to person (contagious) through coughing, sneezing, and touching contaminated objects. °SIGNS AND SYMPTOMS  °· Cough.   °· Fever.   °· Coughing up mucus.   °· Body aches.   °· Chest congestion.   °· Chills.   °· Shortness of breath.   °· Sore throat.   °DIAGNOSIS  °Acute bronchitis is usually diagnosed through a physical exam. Your health care provider will also ask you questions about your medical history. Tests, such as chest X-rays, are sometimes done to rule out other conditions.  °TREATMENT  °Acute bronchitis usually goes away in a couple weeks. Oftentimes, no medical treatment is necessary. Medicines are sometimes given for relief of fever or cough. Antibiotic medicines are usually not needed but may be prescribed in certain situations. In some cases, an inhaler may be recommended to help reduce shortness of breath and control the cough. A cool mist vaporizer may also be used to help thin bronchial secretions and make it easier to clear the chest.  °HOME CARE INSTRUCTIONS °· Get plenty of rest.   °· Drink enough fluids to keep your urine clear or pale yellow (unless you have a medical condition that requires fluid restriction). Increasing fluids may help thin your respiratory secretions (sputum) and reduce chest congestion, and it will prevent dehydration.   °· Take medicines only as directed by your health care provider. °· If  you were prescribed an antibiotic medicine, finish it all even if you start to feel better. °· Avoid smoking and secondhand smoke. Exposure to cigarette smoke or irritating chemicals will make bronchitis worse. If you are a smoker, consider using nicotine gum or skin patches to help control withdrawal symptoms. Quitting smoking will help your lungs heal faster.   °· Reduce the chances of another bout of acute bronchitis by washing your hands frequently, avoiding people with cold symptoms, and trying not to touch your hands to your mouth, nose, or eyes.   °· Keep all follow-up visits as directed by your health care provider.   °SEEK MEDICAL CARE IF: °Your symptoms do not improve after 1 week of treatment.  °SEEK IMMEDIATE MEDICAL CARE IF: °· You develop an increased fever or chills.   °· You have chest pain.   °· You have severe shortness of breath. °· You have bloody sputum.   °· You develop dehydration. °· You faint or repeatedly feel like you are going to pass out. °· You develop repeated vomiting. °· You develop a severe headache. °MAKE SURE YOU:  °· Understand these instructions. °· Will watch your condition. °· Will get help right away if you are not doing well or get worse. °  °This information is not intended to replace advice given to you by your health care provider. Make sure you discuss any questions you have with your health care provider. °  °Document Released: 07/17/2004 Document Revised: 06/30/2014 Document Reviewed: 11/30/2012 °Elsevier Interactive Patient Education ©2016 [employer] ° °Upper Respiratory Infection, Adult °Most upper respiratory infections (URIs) are a viral infection of the air passages leading   to the lungs. A URI affects the nose, throat, and upper air passages. The most common type of URI is nasopharyngitis and is typically referred to as "the common cold." °URIs run their course and usually go away on their own. Most of the time, a URI does not require medical attention, but  sometimes a bacterial infection in the upper airways can follow a viral infection. This is called a secondary infection. Sinus and middle ear infections are common types of secondary upper respiratory infections. °Bacterial pneumonia can also complicate a URI. A URI can worsen asthma and chronic obstructive pulmonary disease (COPD). Sometimes, these complications can require emergency medical care and may be life threatening.  °CAUSES °Almost all URIs are caused by viruses. A virus is a type of germ and can spread from one person to another.  °RISKS FACTORS °You may be at risk for a URI if:  °· You smoke.   °· You have chronic heart or lung disease. °· You have a weakened defense (immune) system.   °· You are very young or very old.   °· You have nasal allergies or asthma. °· You work in crowded or poorly ventilated areas. °· You work in health care facilities or schools. °SIGNS AND SYMPTOMS  °Symptoms typically develop 2-3 days after you come in contact with a cold virus. Most viral URIs last 7-10 days. However, viral URIs from the influenza virus (flu virus) can last 14-18 days and are typically more severe. Symptoms may include:  °· Runny or stuffy (congested) nose.   °· Sneezing.   °· Cough.   °· Sore throat.   °· Headache.   °· Fatigue.   °· Fever.   °· Loss of appetite.   °· Pain in your forehead, behind your eyes, and over your cheekbones (sinus pain). °· Muscle aches.   °DIAGNOSIS  °Your health care provider may diagnose a URI by: °· Physical exam. °· Tests to check that your symptoms are not due to another condition such as: °¨ Strep throat. °¨ Sinusitis. °¨ Pneumonia. °¨ Asthma. °TREATMENT  °A URI goes away on its own with time. It cannot be cured with medicines, but medicines may be prescribed or recommended to relieve symptoms. Medicines may help: °· Reduce your fever. °· Reduce your cough. °· Relieve nasal congestion. °HOME CARE INSTRUCTIONS  °· Take medicines only as directed by your health care  provider.   °· Gargle warm saltwater or take cough drops to comfort your throat as directed by your health care provider. °· Use a warm mist humidifier or inhale steam from a shower to increase air moisture. This may make it easier to breathe. °· Drink enough fluid to keep your urine clear or pale yellow.   °· Eat soups and other clear broths and maintain good nutrition.   °· Rest as needed.   °· Return to work when your temperature has returned to normal or as your health care provider advises. You may need to stay home longer to avoid infecting others. You can also use a face mask and careful hand washing to prevent spread of the virus. °· Increase the usage of your inhaler if you have asthma.   °· Do not use any tobacco products, including cigarettes, chewing tobacco, or electronic cigarettes. If you need help quitting, ask your health care provider. °PREVENTION  °The best way to protect yourself from getting a cold is to practice good hygiene.  °· Avoid oral or hand contact with people with cold symptoms.   °· Wash your hands often if contact occurs.   °There is no clear evidence that   vitamin C, vitamin E, echinacea, or exercise reduces the chance of developing a cold. However, it is always recommended to get plenty of rest, exercise, and practice good nutrition.  °SEEK MEDICAL CARE IF:  °· You are getting worse rather than better.   °· Your symptoms are not controlled by medicine.   °· You have chills. °· You have worsening shortness of breath. °· You have brown or red mucus. °· You have yellow or brown nasal discharge. °· You have pain in your face, especially when you bend forward. °· You have a fever. °· You have swollen neck glands. °· You have pain while swallowing. °· You have white areas in the back of your throat. °SEEK IMMEDIATE MEDICAL CARE IF:  °· You have severe or persistent: °¨ Headache. °¨ Ear pain. °¨ Sinus pain. °¨ Chest pain. °· You have chronic lung disease and any of the  following: °¨ Wheezing. °¨ Prolonged cough. °¨ Coughing up blood. °¨ A change in your usual mucus. °· You have a stiff neck. °· You have changes in your: °¨ Vision. °¨ Hearing. °¨ Thinking. °¨ Mood. °MAKE SURE YOU:  °· Understand these instructions. °· Will watch your condition. °· Will get help right away if you are not doing well or get worse. °  °This information is not intended to replace advice given to you by your health care provider. Make sure you discuss any questions you have with your health care provider. °  °Document Released: 12/03/2000 Document Revised: 10/24/2014 Document Reviewed: 09/14/2013 °Elsevier Interactive Patient Education ©2016 [employer]vier Inc. ° °

## 2015-06-25 NOTE — ED Provider Notes (Signed)
[facility] Emergency Department Provider Note  ____________________________________________  Time seen: Approximately 12:25 PM  I have reviewed the triage vital signs and the nursing notes.   HISTORY  Chief Complaint Fever and URI    HPI Misty Black is a 65 y.o. female who presents with complaints of cough, congestion 2 days. Patient reports fever started yesterday. Complains of weakness or energy feels like she is dehydrated. In addition complains of having cloudy urine with an odor to it.   Past Medical History  Diagnosis Date  . GERD (gastroesophageal reflux disease)   . Asthma   . Hypertension   . Arthritis     Back - shoulders  . Diabetes mellitus without complication (HCC)     There are no active problems to display for this patient.   Past Surgical History  Procedure Laterality Date  . Breast cyst aspiration      not sure which side  . Abdominal hysterectomy  2003  . Cataract extraction Bilateral   . Appendectomy    . Tonsillectomy    . Bladder surgery      as child  . Esophagogastroduodenoscopy N/A 11/08/2014    Procedure: ESOPHAGOGASTRODUODENOSCOPY (EGD) with dialation;  Surgeon: Wallace Cullens, MD;  Location: [facility];  Service: Gastroenterology;  Laterality: N/A;    Current Outpatient Rx  Name  Route  Sig  Dispense  Refill  . albuterol (PROVENTIL HFA;VENTOLIN HFA) 108 (90 BASE) MCG/ACT inhaler   Inhalation   Inhale 2 puffs into the lungs every 6 (six) hours as needed for wheezing or shortness of breath.         Marland Kitchen aspirin 81 MG tablet   Oral   Take 81 mg by mouth daily. PM         . azithromycin (ZITHROMAX Z-PAK) 250 MG tablet      Take 2 tablets (500 mg) on  Day 1,  followed by 1 tablet (250 mg) once daily on Days 2 through 5.   6 each   0   . guaiFENesin-codeine 100-10 MG/5ML syrup   Oral   Take 10 mLs by mouth every 4 (four) hours as needed for cough.   180 mL   0   . lisinopril (PRINIVIL,ZESTRIL)  20 MG tablet   Oral   Take 20 mg by mouth daily. AM         . MAGNESIUM PO   Oral   Take by mouth daily. AM         . pantoprazole (PROTONIX) 40 MG tablet   Oral   Take 40 mg by mouth daily. PM         . pseudoephedrine (SUDAFED) 60 MG tablet   Oral   Take 1 tablet (60 mg total) by mouth every 4 (four) hours as needed for congestion.   24 tablet   0   . triamterene-hydrochlorothiazide (DYAZIDE) 37.5-25 MG per capsule   Oral   Take 1 capsule by mouth daily. AM           Allergies Dairy aid; Ibuprofen; Sulfa antibiotics; and Sulfur  Family History  Problem Relation Age of Onset  . Breast cancer Mother 62    Social History Social History  Substance Use Topics  . Smoking status: Former Smoker    Quit date: 06/23/1994  . Smokeless tobacco: None  . Alcohol Use: No    Review of Systems Constitutional: No fever/chills Eyes: No visual changes. ENT: Positive sore throat. Positive for runny nose  and congestion. Cardiovascular: Denies chest pain. Respiratory: Denies shortness of breath. Positive for cough Gastrointestinal: No abdominal pain.  No nausea, no vomiting.  No diarrhea.  No constipation. Genitourinary: Negative for dysuria. Positive for multiple old worse urine. Musculoskeletal: Negative for back pain. Skin: Negative for rash. Neurological: Negative for headaches, focal weakness or numbness.  10-point ROS otherwise negative.  ____________________________________________   PHYSICAL EXAM:  VITAL SIGNS: ED Triage Vitals  Enc Vitals Group     BP 06/25/15 1203 127/63 mmHg     Pulse Rate 06/25/15 1203 85     Resp 06/25/15 1203 22     Temp 06/25/15 1203 98.4 F (36.9 C)     Temp Source 06/25/15 1203 Oral     SpO2 06/25/15 1203 95 %     Weight 06/25/15 1203 180 lb (81.647 kg)     Height 06/25/15 1203 5' (1.524 m)     Head Cir --      Peak Flow --      Pain Score 06/25/15 1204 0     Pain Loc --      Pain Edu? --      Excl. in GC? --      Constitutional: Alert and oriented. Well appearing and in no acute distress. Eyes: Conjunctivae are normal. PERRL. EOMI. Head: Atraumatic. Nose: Positive with nasal congestion/rhinnorhea. Positive turbinate edema bilaterally Mouth/Throat: Mucous membranes are moist.  Oropharynx non-erythematous. Neck: No stridor.   Cardiovascular: Normal rate, regular rhythm. Grossly normal heart sounds.  Good peripheral circulation. Respiratory: Normal respiratory effort.  No retractions. Lungs decreased breath sounds. Increased coughing associated with deep breaths. I noted Gastrointestinal: Soft and nontender. No distention. No abdominal bruits. No CVA tenderness. Musculoskeletal: No lower extremity tenderness nor edema.  No joint effusions. Neurologic:  Normal speech and language. No gross focal neurologic deficits are appreciated. No gait instability. Skin:  Skin is warm, dry and intact. No rash noted. Psychiatric: Mood and affect are normal. Speech and behavior are normal.  ____________________________________________   LABS (all labs ordered are listed, but only abnormal results are displayed)  Labs Reviewed  BASIC METABOLIC PANEL - Abnormal; Notable for the following:    Glucose, Bld 113 (*)    All other components within normal limits  CBC WITH DIFFERENTIAL/PLATELET - Abnormal; Notable for the following:    WBC 11.9 (*)    Neutro Abs 8.8 (*)    Monocytes Absolute 1.0 (*)    All other components within normal limits  URINALYSIS COMPLETEWITH MICROSCOPIC ([facility] ONLY) - Abnormal; Notable for the following:    Color, Urine AMBER (*)    APPearance HAZY (*)    Bacteria, UA RARE (*)    Squamous Epithelial / LPF 0-5 (*)    All other components within normal limits   ____________________________________________ RADIOLOGY  IMPRESSION: 1. No acute findings in the chest. 2. Irregular nodular density over the left upper lung on chest x-ray earlier today represents mineralization of the left  first costochondral cartilage. ____________________________________________   PROCEDURES  Procedure(s) performed: None  Critical Care performed: No  ____________________________________________   INITIAL IMPRESSION / ASSESSMENT AND PLAN / ED COURSE  Pertinent labs & imaging results that were available during my care of the patient were reviewed by me and considered in my medical decision making (see chart for details).  Acute bronchitis/upper respiratory infection. Rx given for Zithromax, Robitussin-AC, Sudafed 60 mg 4 times a day. Patient follow-up PCP or return to the ER with any worsening symptomology. Patient to return to  work on Thursday, 06/28/2015. ____________________________________________   FINAL CLINICAL IMPRESSION(S) / ED DIAGNOSES  Final diagnoses:  Acute URI  Acute bronchitis, unspecified organism      Evangeline Dakin, PA-C 06/25/15 1432  Sharyn Creamer, MD 06/25/15 [phone]

## 2015-06-25 NOTE — ED Notes (Signed)
Pt reports cough, congestion since Saturday, reports fever started yesterday, did not take temperature. Pt reports taking tylenol, last dose was early this morning.

## 2015-09-27 ENCOUNTER — Ambulatory Visit: Payer: Self-pay | Admitting: Urology

## 2017-10-21 DEATH — deceased
